# Patient Record
Sex: Male | Born: 1975 | Race: Black or African American | Hispanic: No | Marital: Single | Smoking: Current every day smoker
Health system: Southern US, Community
[De-identification: ages and names within clinical notes are randomized; demographics above are authoritative.]

---

## 2004-08-02 ENCOUNTER — Emergency Department: Payer: Self-pay | Admitting: Emergency Medicine

## 2008-01-10 ENCOUNTER — Emergency Department: Payer: Self-pay | Admitting: Emergency Medicine

## 2011-03-28 ENCOUNTER — Emergency Department: Payer: Self-pay | Admitting: *Deleted

## 2012-08-05 IMAGING — CR DG CHEST 2V
1 series · 2 of 2 positions shown · non-contrast
Comparison: none

REASON FOR EXAM: right sided chest pain
COMMENTS:   May transport without cardiac monitor

PROCEDURE:     DXR - DXR CHEST PA (OR AP) AND LATERAL  - March 28, 2011  [DATE]
RESULT:     The lung fields are clear. The heart, mediastinal and osseous
structures reveal no significant abnormalities.

[Series 1: view not recorded · 0.17mm/px · 2 of 2 slices shown]
[im 1/2]
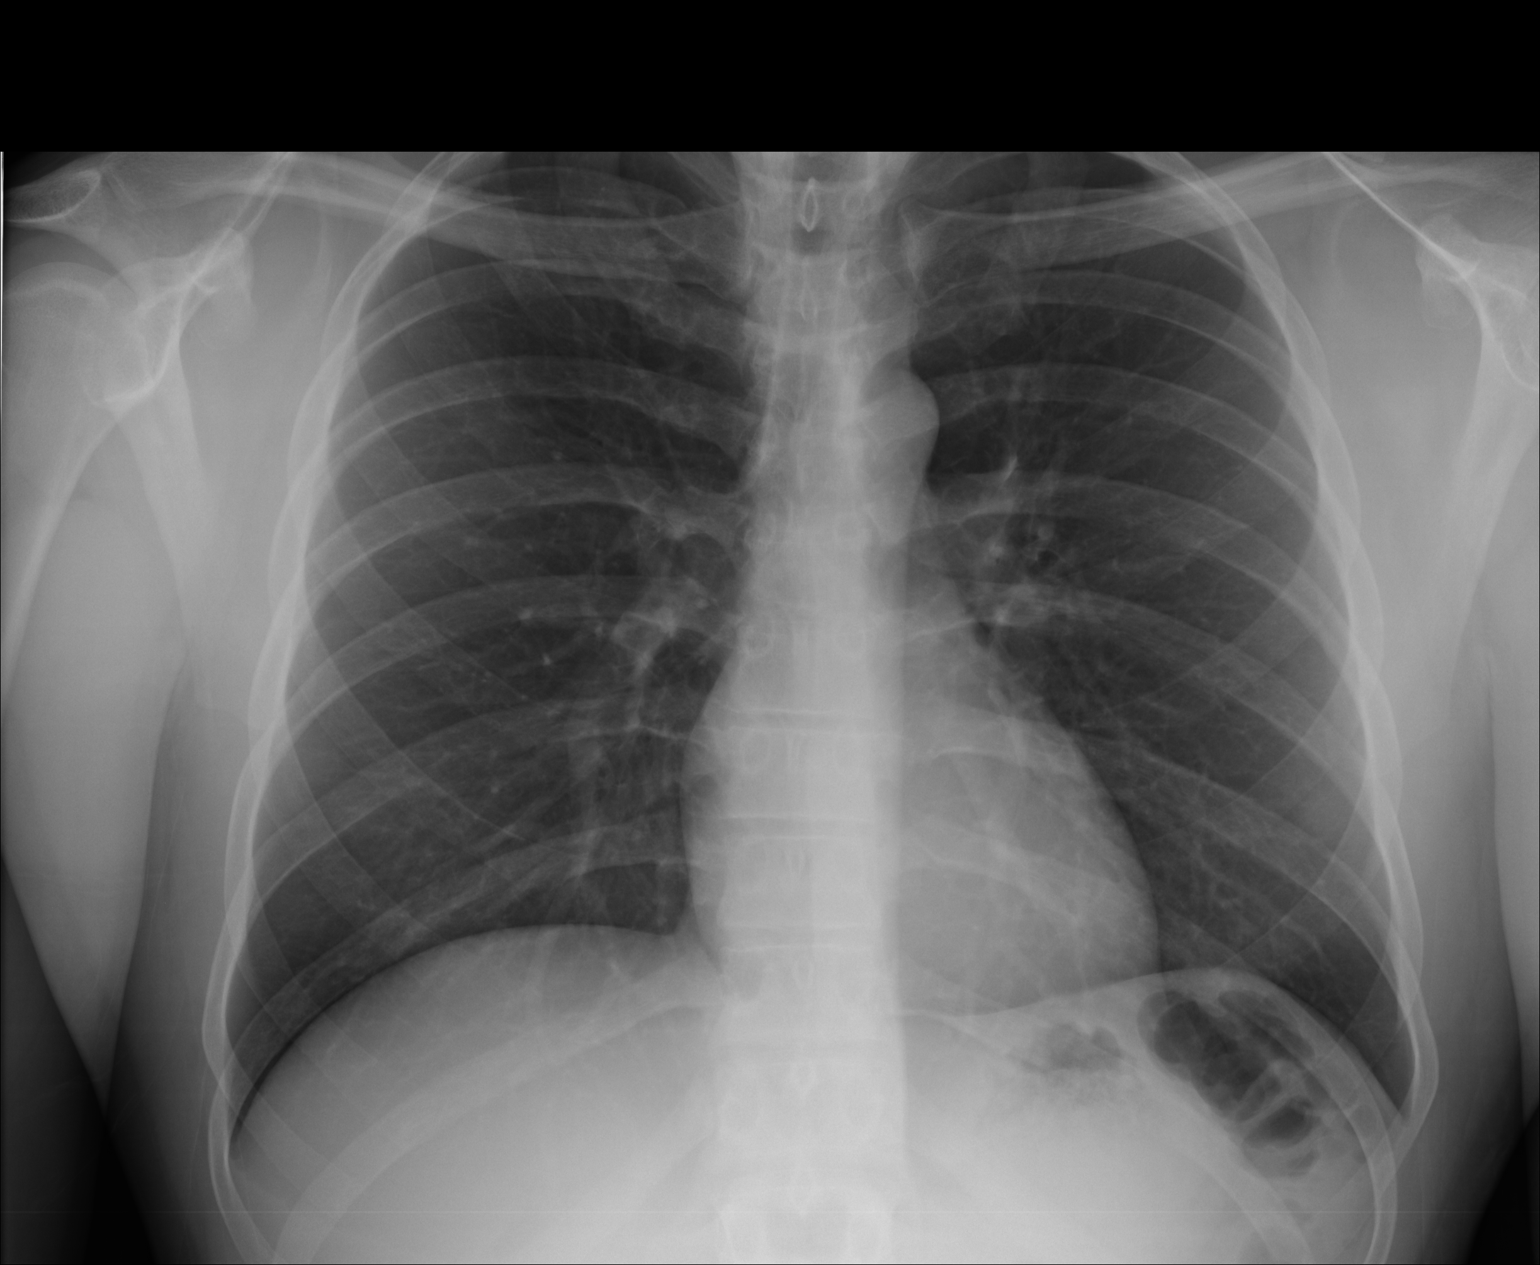
[im 2/2]
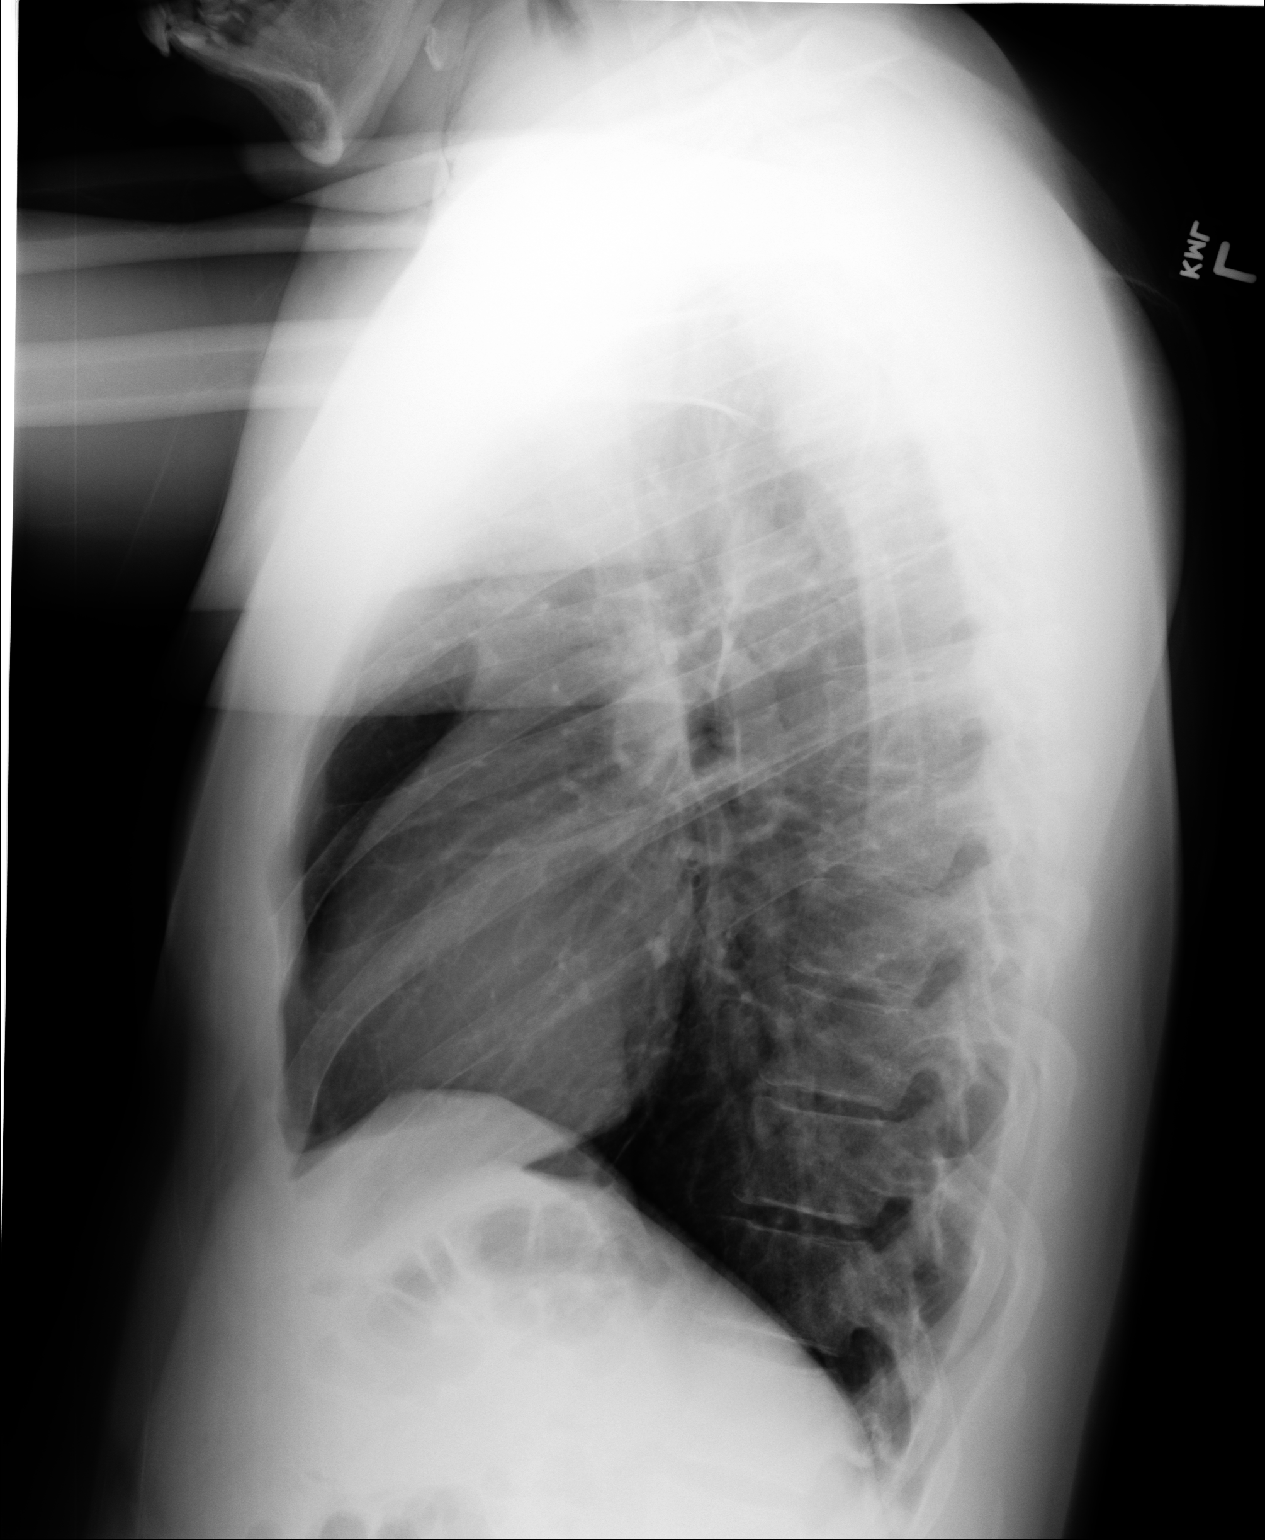

[2 of 2 positions shown; findings below may reference images not displayed]

IMPRESSION: No acute changes are identified.

## 2016-11-19 ENCOUNTER — Encounter: Payer: Self-pay | Admitting: Emergency Medicine

## 2016-11-19 ENCOUNTER — Emergency Department
Admission: EM | Admit: 2016-11-19 | Discharge: 2016-11-19 | Disposition: A | Discharge status: Home / Self Care | Payer: Self-pay | Attending: Emergency Medicine | Admitting: Emergency Medicine

## 2016-11-19 DIAGNOSIS — R55 Syncope and collapse: Secondary | ICD-10-CM

## 2016-11-19 DIAGNOSIS — F17210 Nicotine dependence, cigarettes, uncomplicated: Secondary | ICD-10-CM | POA: Insufficient documentation

## 2016-11-19 DIAGNOSIS — B9789 Other viral agents as the cause of diseases classified elsewhere: Secondary | ICD-10-CM

## 2016-11-19 DIAGNOSIS — E860 Dehydration: Secondary | ICD-10-CM | POA: Insufficient documentation

## 2016-11-19 DIAGNOSIS — J069 Acute upper respiratory infection, unspecified: Secondary | ICD-10-CM | POA: Insufficient documentation

## 2016-11-19 LAB — BASIC METABOLIC PANEL
Anion gap: 7 (ref 5–15)
BUN: 7 mg/dL (ref 6–20)
CHLORIDE: 105 mmol/L (ref 101–111)
CO2: 28 mmol/L (ref 22–32)
Calcium: 9 mg/dL (ref 8.9–10.3)
Creatinine, Ser: 1.13 mg/dL (ref 0.61–1.24)
GFR calc non Af Amer: >60 mL/min (ref >60)
Glucose, Bld: 121 mg/dL — ABNORMAL HIGH (ref 65–99)
POTASSIUM: 3.3 mmol/L — AB (ref 3.5–5.1)
SODIUM: 140 mmol/L (ref 135–145)

## 2016-11-19 LAB — CBC
HEMATOCRIT: 41.1 % (ref 40.0–52.0)
Hemoglobin: 13.5 g/dL (ref 13.0–18.0)
MCH: 30.2 pg (ref 26.0–34.0)
MCHC: 32.8 g/dL (ref 32.0–36.0)
MCV: 92 fL (ref 80.0–100.0)
Platelets: 287 10*3/uL (ref 150–440)
RBC: 4.47 MIL/uL (ref 4.40–5.90)
RDW: 14.6 % — ABNORMAL HIGH (ref 11.5–14.5)
WBC: 8.1 10*3/uL (ref 3.8–10.6)

## 2016-11-19 MED ORDER — PREDNISONE 20 MG PO TABS
40.0000 mg | ORAL_TABLET | ORAL | Status: AC
  Administered 2016-11-19: 40 mg via ORAL
  Filled 2016-11-19: qty 2

## 2016-11-19 MED ORDER — PREDNISONE 20 MG PO TABS: 40.0000 mg | ORAL_TABLET | Freq: Every day | ORAL | 0 refills | Status: DC

## 2016-11-19 MED ORDER — ONDANSETRON 4 MG PO TBDP: 4.0000 mg | ORAL_TABLET | Freq: Three times a day (TID) | ORAL | 0 refills | Status: DC | PRN

## 2016-11-19 NOTE — ED Notes (Signed)
Pt able to take in and keep down solid foods at this time, pt has only taken in approximately 4oz of 12oz cup of water.  Significance of oral intake to sustain adequate hydration reviewed with patient at this time; pt expresses verbal understanding.

## 2016-11-19 NOTE — ED Triage Notes (Signed)
C/O dizziness, intermittently, since Friday.  Also c/o cough and sinus congestion since Friday.

## 2016-11-19 NOTE — ED Provider Notes (Signed)
[facility] Emergency Department Provider Note  ____________________________________________  Time seen: Approximately 3:10 PM  I have reviewed the triage vital signs and the nursing notes.   HISTORY  Chief Complaint URI and Dizziness    HPI Zachary Velazquez is a 41 y.o. male who complains of lightheadedness when standing up for the past several days. He has fiance been busy caring for their 36-month-old son who is recently diagnosed with a respiratory infection and asthma 4 days ago. Around that time the patient has been forgetting to eat and drink and not taking much by mouth. He also has developed a low-grade fever and has chills. Also has a nonproductive cough. Denies chest pain or shortness of breath. Denies headache or neck stiffness. Does have a sore throat. Sinus pressure and congestion.Marland Kitchen     History reviewed. No pertinent past medical history.   There are no active problems to display for this patient.    History reviewed. No pertinent surgical history.   Prior to Admission medications   Medication Sig Start Date End Date Taking? Authorizing Provider  ondansetron (ZOFRAN ODT) 4 MG disintegrating tablet Take 1 tablet (4 mg total) by mouth every 8 (eight) hours as needed for nausea or vomiting. 11/19/16   Sharman Cheek, MD  predniSONE (DELTASONE) 20 MG tablet Take 2 tablets (40 mg total) by mouth daily. 11/19/16   Sharman Cheek, MD     Allergies Patient has no known allergies.   No family history on file.  Social History Social History  Substance Use Topics  . Smoking status: Current Every Day Smoker    Packs/day: 0.50    Types: Cigarettes  . Smokeless tobacco: Never Used  . Alcohol use No    Review of Systems  Constitutional:   Positive fever and chills.  ENT:   Positive sore throat and sinus congestion. Cardiovascular:   No chest pain. No palpitations Respiratory:   No dyspnea positive nonproductive  cough. Gastrointestinal:   Negative for abdominal pain, vomiting and diarrhea.  Genitourinary:   Negative for dysuria or difficulty urinating. Musculoskeletal:   Negative for focal pain or swelling Neurological:   Negative for headaches. Positive dizziness and passing out 10-point ROS otherwise negative.  ____________________________________________   PHYSICAL EXAM:  VITAL SIGNS: ED Triage Vitals  Enc Vitals Group     BP 11/19/16 1221 (!) 154/88     Pulse Rate 11/19/16 1221 (!) 102     Resp 11/19/16 1221 20     Temp 11/19/16 1221 100 F (37.8 C)     Temp Source 11/19/16 1221 Oral     SpO2 11/19/16 1221 99 %     Weight 11/19/16 1221 180 lb (81.6 kg)     Height 11/19/16 1221  (1.854 m)     Head Circumference --      Peak Flow --      Pain Score 11/19/16 1224 8     Pain Loc --      Pain Edu? --      Excl. in GC? --     Vital signs reviewed, nursing assessments reviewed.   Constitutional:   Alert and oriented. Well appearing and in no distress. Eyes:   No scleral icterus. No conjunctival pallor. PERRL. EOMI.  No nystagmus. ENT   Head:   Normocephalic and atraumatic.Normal TMs.   Nose:   No congestion/rhinnorhea. No septal hematoma   Mouth/Throat:   MMM, Positive pharyngeal erythema. No peritonsillar mass.    Neck:   No  stridor. No SubQ emphysema. No meningismus. Hematological/Lymphatic/Immunilogical:   No cervical lymphadenopathy. Cardiovascular:   Tachycardia 100 supine. When sitting upright, heart rate increases to 110.Marland Kitchen Symmetric bilateral radial and DP pulses.  No murmurs.  Respiratory:   Normal respiratory effort without tachypnea nor retractions. Breath sounds are clear and equal bilaterally. No wheezes/rales/rhonchi. High-pitched cough consistent with wheezing. Gastrointestinal:   Soft and nontender. Non distended. There is no CVA tenderness.  No rebound, rigidity, or guarding. Genitourinary:   deferred Musculoskeletal:   Normal range of motion in  all extremities. No joint effusions.  No lower extremity tenderness.  No edema. Neurologic:   Normal speech and language.  CN 2-10 normal. Motor grossly intact. No gross focal neurologic deficits are appreciated.  Skin:    Skin is warm, dry and intact. No rash noted.  No petechiae, purpura, or bullae.  ____________________________________________    LABS (pertinent positives/negatives) (all labs ordered are listed, but only abnormal results are displayed) Labs Reviewed  BASIC METABOLIC PANEL - Abnormal; Notable for the following:       Result Value   Potassium 3.3 (*)    Glucose, Bld 121 (*)    All other components within normal limits  CBC - Abnormal; Notable for the following:    RDW 14.6 (*)    All other components within normal limits  URINALYSIS, COMPLETE (UACMP) WITH MICROSCOPIC  CBG MONITORING, ED   ____________________________________________   EKG  Interpreted by me Sinus tachycardia rate 107, normal axis and intervals. Normal QRS ST segments and T waves. One PVC on the strip.  ____________________________________________    RADIOLOGY  No results found.  ____________________________________________   PROCEDURES Procedures  ____________________________________________   INITIAL IMPRESSION / ASSESSMENT AND PLAN / ED COURSE  Pertinent labs & imaging results that were available during my care of the patient were reviewed by me and considered in my medical decision making (see chart for details).  Patient well appearing no acute distress, presents with low-grade fever, sore throat and nonproductive cough dizziness and syncope. By history and exam this is consistent with a viral respiratory illness as well as dehydration.  Considering the patient's symptoms, medical history, and physical examination today, I have low suspicion for ischemic stroke, intracranial hemorrhage, meningitis, encephalitis, carotid or vertebral dissection, venous sinus thrombosis, MS,  intracranial hypertension, glaucoma, CRAO, CRVO, or temporal arteritis.  Offered patient IV fluids for hydration, he would rather take fluids by mouth. No problem tolerating oral intake. Patient counseled to be sure he drinks lots of fluids. Given prednisone for symptom relief as well especially with wheezy cough.        ____________________________________________   FINAL CLINICAL IMPRESSION(S) / ED DIAGNOSES  Final diagnoses:  Viral URI with cough  Dehydration  Syncope, unspecified syncope type      New Prescriptions   ONDANSETRON (ZOFRAN ODT) 4 MG DISINTEGRATING TABLET    Take 1 tablet (4 mg total) by mouth every 8 (eight) hours as needed for nausea or vomiting.   PREDNISONE (DELTASONE) 20 MG TABLET    Take 2 tablets (40 mg total) by mouth daily.     Portions of this note were generated with dragon dictation software. Dictation errors may occur despite best attempts at proofreading.    Sharman Cheek, MD 11/19/16 [phone]

## 2016-11-19 NOTE — ED Notes (Signed)
Pt provided graham crackers and peanut butter with 2 cups of water for po challenge.  Oral hydration encouraged.

## 2016-11-19 NOTE — ED Notes (Signed)
Pt unable to urinate at this moment, given a specimen cup and sent back out to lobby for when is able to void.

## 2017-10-04 ENCOUNTER — Other Ambulatory Visit: Payer: Self-pay

## 2017-10-04 ENCOUNTER — Encounter: Payer: Self-pay | Admitting: Emergency Medicine

## 2017-10-04 ENCOUNTER — Emergency Department
Admission: EM | Admit: 2017-10-04 | Discharge: 2017-10-04 | Disposition: A | Discharge status: Home / Self Care | Payer: Self-pay | Attending: Emergency Medicine | Admitting: Emergency Medicine

## 2017-10-04 DIAGNOSIS — B070 Plantar wart: Secondary | ICD-10-CM | POA: Insufficient documentation

## 2017-10-04 DIAGNOSIS — F17210 Nicotine dependence, cigarettes, uncomplicated: Secondary | ICD-10-CM | POA: Insufficient documentation

## 2017-10-04 NOTE — ED Triage Notes (Signed)
R foot pain x several years. Denies injury.

## 2017-10-04 NOTE — ED Notes (Signed)
NAD noted at time of D/C. Pt denies questions or concerns. Pt ambulatory to the lobby at this time.  

## 2017-10-04 NOTE — ED Provider Notes (Signed)
[facility] Emergency Department Provider Note   ____________________________________________   None    (approximate)  I have reviewed the triage vital signs and the nursing notes.   HISTORY  Chief Complaint Foot Pain    HPI Zachary Velazquez is a 42 y.o. male patient complain right foot pain secondary to a nodular lesion for 2-3 years.  Patient state hard callus deposit around the lesion which he trims with a blade.  Patient stated after each treatment session lesion seems to worsens.  Patient rates pain as a 4/10.  Patient described pain is "aching".  Patient the pain increased with weightbearing.  Patient requests podiatry consult.  There are no active problems to display for this patient.   History reviewed. No pertinent surgical history.  Prior to Admission medications   Medication Sig Start Date End Date Taking? Authorizing Provider  ondansetron (ZOFRAN ODT) 4 MG disintegrating tablet Take 1 tablet (4 mg total) by mouth every 8 (eight) hours as needed for nausea or vomiting. 11/19/16   Sharman Cheek, MD  predniSONE (DELTASONE) 20 MG tablet Take 2 tablets (40 mg total) by mouth daily. 11/19/16   Sharman Cheek, MD    Allergies Patient has no known allergies.  No family history on file.  Social History Social History   Tobacco Use  . Smoking status: Current Every Day Smoker    Packs/day: 0.50    Types: Cigarettes  . Smokeless tobacco: Never Used  Substance Use Topics  . Alcohol use: No  . Drug use: No    Review of Systems Constitutional: No fever/chills Eyes: No visual changes. ENT: No sore throat. Cardiovascular: Denies chest pain. Respiratory: Denies shortness of breath. Gastrointestinal: No abdominal pain.  No nausea, no vomiting.  No diarrhea.  No constipation. Genitourinary: Negative for dysuria. Musculoskeletal: Negative for back pain. Skin: Negative for rash.  Callus plantar aspect of right foot Neurological:  Negative for headaches, focal weakness or numbness.   ____________________________________________   PHYSICAL EXAM:  VITAL SIGNS: ED Triage Vitals  Enc Vitals Group     BP 10/04/17 1015 128/88     Pulse Rate 10/04/17 1015 75     Resp 10/04/17 1015 18     Temp 10/04/17 1015 98.7 F (37.1 C)     Temp Source 10/04/17 1015 Oral     SpO2 10/04/17 1015 99 %     Weight 10/04/17 1017 156 lb (70.8 kg)     Height 10/04/17 1017 6\' 2"  (1.88 m)     Head Circumference --      Peak Flow --      Pain Score 10/04/17 1016 4     Pain Loc --      Pain Edu? --      Excl. in GC? --    Constitutional: Alert and oriented. Well appearing and in no acute distress. Neck: No stridor. Hematological/Lymphatic/Immunilogical: No cervical lymphadenopathy. Cardiovascular: Normal rate, regular rhythm. Grossly normal heart sounds.  Good peripheral circulation. Respiratory: Normal respiratory effort.  No retractions. Lungs CTAB. Neurologic:  Normal speech and language. No gross focal neurologic deficits are appreciated. No gait instability. Skin:  Skin is warm, dry and intact. No rash noted.  Increased callus deposit plantar aspect of first metatarsal head. Psychiatric: Mood and affect are normal. Speech and behavior are normal.  ____________________________________________   LABS (all labs ordered are listed, but only abnormal results are displayed)  Labs Reviewed - No data to display ____________________________________________  EKG   ____________________________________________  RADIOLOGY  ED MD interpretation:    Official radiology report(s): No results found.  ____________________________________________   PROCEDURES  Procedure(s) performed: None  Procedures  Critical Care performed: No  ____________________________________________   INITIAL IMPRESSION / ASSESSMENT AND PLAN / ED COURSE  As part of my medical decision making, I reviewed the following data within the electronic  MEDICAL RECORD NUMBER    Right foot pain secondary to a plantars wart.  Patient also pes planus.  Patient given discharge care instruction advised to follow-up with podiatry for definitive evaluation and treatment.      ____________________________________________   FINAL CLINICAL IMPRESSION(S) / ED DIAGNOSES  Final diagnoses:  Plantar wart, right foot     ED Discharge Orders    None       Note:  This document was prepared using Dragon voice recognition software and may include unintentional dictation errors.    Joni ReiningSmith, Ronald K, PA-C 10/04/17 1059    Governor RooksLord, Rebecca, MD 10/04/17 [phone]

## 2017-10-04 NOTE — Discharge Instructions (Signed)
Advised the use of Dr. Harriet PhoSchole's bunion pads until evaluation by podiatry.

## 2018-10-16 ENCOUNTER — Other Ambulatory Visit: Payer: Self-pay

## 2018-10-16 ENCOUNTER — Emergency Department
Admission: EM | Admit: 2018-10-16 | Discharge: 2018-10-16 | Disposition: A | Discharge status: Home / Self Care | Payer: Self-pay | Attending: Emergency Medicine | Admitting: Emergency Medicine

## 2018-10-16 DIAGNOSIS — B349 Viral infection, unspecified: Secondary | ICD-10-CM

## 2018-10-16 DIAGNOSIS — Z0279 Encounter for issue of other medical certificate: Secondary | ICD-10-CM | POA: Insufficient documentation

## 2018-10-16 NOTE — ED Notes (Signed)
See triage note  States he had some diarrhea for couple of days   No fever or vomiting  Requested a note to go back to work  Afebrile on arrival

## 2018-10-16 NOTE — ED Triage Notes (Signed)
Pt states last week he was out of work for a few days d/t diarrhea. When pt went back to work he was told he needed a note to return to work. Pt arrive with mask and gloves on. Pt states he is here just for a note to state he can return to work.

## 2018-10-16 NOTE — ED Provider Notes (Signed)
[facility] Emergency Department Provider Note   ____________________________________________   First MD Initiated Contact with Patient 10/16/18 1425     (approximate)  I have reviewed the triage vital signs and the nursing notes.   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  Patient said he was absent due to viral respiratory infection and diarrhea.  Patient state complaint has resolved but need medical clearance return to work.      History reviewed. No pertinent past medical history.  There are no active problems to display for this patient.   History reviewed. No pertinent surgical history.  Prior to Admission medications   Not on File    Allergies Patient has no known allergies.  History reviewed. No pertinent family history.  Social History Social History   Tobacco Use  . Smoking status: Current Every Day Smoker    Packs/day: 0.50    Types: Cigarettes  . Smokeless tobacco: Never Used  Substance Use Topics  . Alcohol use: No  . Drug use: No    Review of Systems Constitutional: No fever/chills Eyes: No visual changes. ENT: No sore throat. Cardiovascular: Denies chest pain. Respiratory: Denies shortness of breath. Gastrointestinal: No abdominal pain.  No nausea, no vomiting.  No diarrhea.  No constipation. Genitourinary: Negative for dysuria. Musculoskeletal: Negative for back pain. Skin: Negative for rash. Neurological: Negative for headaches, focal weakness or numbness.   ____________________________________________   PHYSICAL EXAM:  VITAL SIGNS: ED Triage Vitals [10/16/18 1352]  Enc Vitals Group     BP (!) 157/93     Pulse Rate 88     Resp 16     Temp 98.9 F (37.2 C)     Temp Source Oral     SpO2 99 %     Weight 178 lb (80.7 kg)     Height 6\' 1"  (1.854 m)     Head Circumference      Peak Flow      Pain Score 0   Pain Loc      Pain Edu?      Excl. in GC?    Constitutional: Alert and oriented. Well appearing and in no acute distress. Nose: No congestion/rhinnorhea. Mouth/Throat: Mucous membranes are moist.  Oropharynx non-erythematous. Neck: No stridor.   Hematological/Lymphatic/Immunilogical: No cervical lymphadenopathy. Cardiovascular: Normal rate, regular rhythm. Grossly normal heart sounds.  Good peripheral circulation. Respiratory: Normal respiratory effort.  No retractions. Lungs CTAB. Gastrointestinal: Soft and nontender. No distention. No abdominal bruits. No CVA tenderness. Musculoskeletal: No lower extremity tenderness nor edema.  No joint effusions. Neurologic:  Normal speech and language. No gross focal neurologic deficits are appreciated. No gait instability. Skin:  Skin is warm, dry and intact. No rash noted. Psychiatric: Mood and affect are normal. Speech and behavior are normal.  ____________________________________________   LABS (all labs ordered are listed, but only abnormal results are displayed)  Labs Reviewed - No data to display ____________________________________________  EKG   ____________________________________________  RADIOLOGY  ED MD interpretation:    Official radiology report(s): No results found.  ____________________________________________   PROCEDURES  Procedure(s) performed (including Critical Care):  Procedures   ____________________________________________   INITIAL IMPRESSION / ASSESSMENT AND PLAN / ED COURSE  As part of my medical decision making, I reviewed the following data within the electronic MEDICAL RECORD NUMBER         Patient presents with request return medical  clearance return work.  Patient states viral illness has resolved.     ____________________________________________   FINAL CLINICAL IMPRESSION(S) / ED DIAGNOSES  Final diagnoses:  Viral illness     ED Discharge Orders    None       Note:  This  document was prepared using Dragon voice recognition software and may include unintentional dictation errors.    Joni Reining, PA-C 10/16/18 1451    Jene Every, MD 10/16/18 [phone]

## 2021-03-08 ENCOUNTER — Encounter (HOSPITAL_COMMUNITY): Payer: Self-pay | Admitting: *Deleted

## 2021-03-08 ENCOUNTER — Emergency Department (HOSPITAL_COMMUNITY)
Admission: EM | Admit: 2021-03-08 | Discharge: 2021-03-08 | Disposition: A | Discharge status: Home / Self Care | Payer: Self-pay | Attending: Emergency Medicine | Admitting: Emergency Medicine

## 2021-03-08 ENCOUNTER — Other Ambulatory Visit: Payer: Self-pay

## 2021-03-08 DIAGNOSIS — I10 Essential (primary) hypertension: Secondary | ICD-10-CM | POA: Insufficient documentation

## 2021-03-08 DIAGNOSIS — U071 COVID-19: Secondary | ICD-10-CM | POA: Insufficient documentation

## 2021-03-08 DIAGNOSIS — R0981 Nasal congestion: Secondary | ICD-10-CM | POA: Insufficient documentation

## 2021-03-08 DIAGNOSIS — F17210 Nicotine dependence, cigarettes, uncomplicated: Secondary | ICD-10-CM | POA: Insufficient documentation

## 2021-03-08 LAB — RESP PANEL BY RT-PCR (FLU A&B, COVID) ARPGX2
Influenza A by PCR: NEGATIVE
Influenza B by PCR: NEGATIVE
SARS Coronavirus 2 by RT PCR: POSITIVE — AB

## 2021-03-08 MED ORDER — GUAIFENESIN 100 MG/5ML PO SOLN: 5.0000 mL | ORAL | 0 refills | Status: AC | PRN

## 2021-03-08 MED ORDER — NIRMATRELVIR/RITONAVIR (PAXLOVID)TABLET: 3.0000 | ORAL_TABLET | Freq: Two times a day (BID) | ORAL | 0 refills | Status: AC

## 2021-03-08 MED ORDER — ACETAMINOPHEN 325 MG PO TABS: 650.0000 mg | ORAL_TABLET | Freq: Four times a day (QID) | ORAL | 0 refills | Status: AC | PRN

## 2021-03-08 NOTE — ED Triage Notes (Signed)
Girlfriend Covid +, pt developed, cough, headache, body aches on Tuesday.

## 2021-03-08 NOTE — ED Provider Notes (Signed)
[facility]-EMERGENCY DEPT Provider Note   CSN: [id number] Arrival date & time: 03/08/21  0756     History Chief Complaint  Patient presents with   Sore Throat   Generalized Body Aches   Headache   Excessive Sweating    Zachary Velazquez is a 45 y.o. male.  This is a 45 year old male with history as below presenting for evaluation of COVID-19.  Patient ports spouse with similar symptoms and positive COVID-19 diagnosis 4 days ago.  Patient reports he began to have symptoms 2 days ago.  Symptoms include coughing, congestion, runny nose, subjective fever, arthralgias, headaches.  Not currently experiencing a headache.  No neurologic changes.  Taking Tylenol intermittently with significant provement of symptoms.  Patient was sent by his employer for COVID-19 testing.  No chest pain or dyspnea, tolerate oral intake without difficulty.  No change in bowel or bladder function.  Tobacco smoker.  He has completed COVID-19 vaccine x3  The history is provided by the patient. No language interpreter was used.  Sore Throat This is a new problem. The current episode started 2 days ago. Associated symptoms include headaches. Pertinent negatives include no chest pain, no abdominal pain and no shortness of breath.  Headache Associated symptoms: congestion, cough, fever and sore throat   Associated symptoms: no abdominal pain, no back pain, no diarrhea, no ear pain, no eye pain, no nausea, no neck pain, no neck stiffness, no seizures and no vomiting       History reviewed. No pertinent past medical history.  There are no problems to display for this patient.   History reviewed. No pertinent surgical history.     No family history on file.  Social History   Tobacco Use   Smoking status: Every Day    Packs/day: 0.50    Types: Cigarettes   Smokeless tobacco: Never  Vaping Use   Vaping Use: Never used  Substance Use Topics   Alcohol use: No   Drug use: No    Home  Medications Prior to Admission medications   Medication Sig Start Date End Date Taking? Authorizing Provider  acetaminophen (TYLENOL) 325 MG tablet Take 2 tablets (650 mg total) by mouth every 6 (six) hours as needed for up to 5 days for mild pain, headache or fever. 03/08/21 03/13/21 Yes Sloan Leiter, DO  guaiFENesin (ROBITUSSIN) 100 MG/5ML SOLN Take 5 mLs (100 mg total) by mouth every 4 (four) hours as needed for cough or to loosen phlegm. 03/08/21  Yes Sloan Leiter, DO  nirmatrelvir/ritonavir EUA (PAXLOVID) TABS Take 3 tablets by mouth 2 (two) times daily for 5 days. Take nirmatrelvir (150 mg) two tablets twice daily for 5 days and ritonavir (100 mg) one tablet twice daily for 5 days. 03/08/21 03/13/21 Yes Sloan Leiter, DO    Allergies    Patient has no known allergies.  Review of Systems   Review of Systems  Constitutional:  Positive for fever. Negative for chills.  HENT:  Positive for congestion and sore throat. Negative for ear pain.   Eyes:  Negative for pain and visual disturbance.  Respiratory:  Positive for cough. Negative for shortness of breath.   Cardiovascular:  Negative for chest pain and palpitations.  Gastrointestinal:  Negative for abdominal pain, diarrhea, nausea and vomiting.  Genitourinary:  Negative for dysuria and hematuria.  Musculoskeletal:  Positive for arthralgias. Negative for back pain, neck pain and neck stiffness.  Skin:  Negative for color change and rash.  Neurological:  Positive for headaches. Negative for seizures and syncope.  All other systems reviewed and are negative.  Physical Exam Updated Vital Signs BP (!) 145/95 (BP Location: Right Arm)   Temp 99.2 F (37.3 C) (Oral)   Resp 18   Ht 6\' 1"  (1.854 m)   Wt 86.2 kg   SpO2 99%   BMI 25.07 kg/m   Physical Exam Vitals and nursing note reviewed.  Constitutional:      General: He is not in acute distress.    Appearance: Normal appearance. He is well-developed.  HENT:     Head: Normocephalic and  atraumatic.     Comments: No evidence of deep space infection, uvula is midline, no trismus     Right Ear: External ear normal.     Left Ear: External ear normal.     Nose: Congestion present.     Mouth/Throat:     Mouth: Mucous membranes are moist.  Eyes:     General: No scleral icterus. Cardiovascular:     Rate and Rhythm: Normal rate and regular rhythm.     Pulses: Normal pulses.     Heart sounds: Normal heart sounds.  Pulmonary:     Effort: Pulmonary effort is normal. No respiratory distress.     Breath sounds: Normal breath sounds.  Abdominal:     General: Abdomen is flat.     Palpations: Abdomen is soft.     Tenderness: There is no abdominal tenderness.  Musculoskeletal:        General: Normal range of motion.     Cervical back: Normal range of motion.     Right lower leg: No edema.     Left lower leg: No edema.  Skin:    General: Skin is warm and dry.     Capillary Refill: Capillary refill takes less than 2 seconds.  Neurological:     General: No focal deficit present.     Mental Status: He is alert and oriented to person, place, and time.     GCS: GCS eye subscore is 4. GCS verbal subscore is 5. GCS motor subscore is 6.     Comments: ambulatory  Psychiatric:        Mood and Affect: Mood normal.        Behavior: Behavior normal.    ED Results / Procedures / Treatments   Labs (all labs ordered are listed, but only abnormal results are displayed) Labs Reviewed  RESP PANEL BY RT-PCR (FLU A&B, COVID) ARPGX2 - Abnormal; Notable for the following components:      Result Value   SARS Coronavirus 2 by RT PCR POSITIVE (*)    All other components within normal limits    EKG None  Radiology No results found.  Procedures Procedures   Medications Ordered in ED Medications - No data to display  ED Course  I have reviewed the triage vital signs and the nursing notes.  Pertinent labs & imaging results that were available during my care of the patient were  reviewed by me and considered in my medical decision making (see chart for details).    MDM Rules/Calculators/A&P                           This is 45 year old male presenting for evaluation of COVID-19.  Patient no acute distress.  Mildly hypertensive.  Vital signs otherwise unremarkable.  He is breathing comfortably room air.  Clearly in full sentences.  Patient found to  be positive COVID-19.  Onset 2 days ago.  Discussed supportive care with the patient.  Previous metabolic panel with normal renal function.  He is vaccinated for COVID-19.  Will prescribe Paxlovid, work note.  Discussed strict return precautions  10:12 AM  Patient in no acute distress and stable for discharge at this time. Advised him to f/u with his PCP within next 48 hours or RTED for worsening or worrisome symptoms.  Patient agrees with plan/understands plan, all questions answered prior to discharge. Final Clinical Impression(s) / ED Diagnoses Final diagnoses:  COVID-19    Rx / DC Orders ED Discharge Orders          Ordered    guaiFENesin (ROBITUSSIN) 100 MG/5ML SOLN  Every 4 hours PRN        03/08/21 0942    acetaminophen (TYLENOL) 325 MG tablet  Every 6 hours PRN        03/08/21 0942    nirmatrelvir/ritonavir EUA (PAXLOVID) TABS  2 times daily        03/08/21 0942             Sloan Leiter, DO 03/08/21 1013
